# Patient Record
Sex: Male | Born: 1960 | Race: White | Hispanic: No | Marital: Married
Health system: Midwestern US, Community
[De-identification: ages and names within clinical notes are randomized; demographics above are authoritative.]

---

## 2016-07-20 LAB — CBC W/O DIFF
HCT: 40.2 % — ABNORMAL LOW (ref 42.00–52.00)
HGB: 14.6 g/dL (ref 14.00–18.00)
MCH: 30.8 pg (ref 28.0–34.0)
MCHC: 36.3 g/dL — ABNORMAL HIGH (ref 32.0–36.0)
MCV: 84.8 fL (ref 80.0–100.0)
MPV: 11.2 fL (ref 7.0–12.0)
PLATELET: 196 X10 (ref 150–400)
RBC: 4.74 x10 (ref 4.50–6.00)
RDW-CV: 12.7 % (ref 11.5–13.5)
WBC: 6.3 X10 (ref 4.8–10.8)

## 2016-07-20 LAB — PROTHROMBIN TIME + INR: INR: 1 (ref 0.9–1.1)

## 2016-07-20 LAB — DIFFERENTIAL, AUTO.
ABS. BASOPHILS: 0 x10 (ref 0.0–0.2)
ABS. EOSINOPHILS: 0.1 x10 (ref 0.0–0.5)
ABS. IMMATURE GRANS.: 0.01 x10 (ref 0.00–0.03)
ABS. LYMPHOCYTES: 1.7 x10 (ref 1.0–4.5)
ABS. MONOCYTES: 0.6 x10 (ref 0.1–0.8)
ABS. NEUTROPHILS: 3.9 x10 (ref 1.9–7.8)
ABSOLUTE NRBC: 0 x10
BASOPHILS: 1 %
EOSINOPHILS: 1 %
IMMATURE GRANULOCYTES: 0 %
LYMPHOCYTES: 27 %
MONOCYTES: 9 %
NEUTROPHILS: 63 %
NRBC: 0 /100 WBC (ref 0–1)

## 2016-07-20 LAB — METABOLIC PANEL, COMPREHENSIVE
A-G Ratio: 1.3
ALT (SGPT): 18 U/L (ref 3–35)
AST (SGOT): 19 U/L (ref 15–40)
Albumin: 4.5 g/dL (ref 3.5–5.0)
Alk. phosphatase: 56 U/L (ref 35–100)
Anion gap: 14 mmol/L
BUN/Creatinine ratio: 22.2
BUN: 20 mg/dL (ref 7–20)
Bilirubin, total: 0.9 mg/dL (ref 0.1–1.2)
CO2: 22 mmol/L (ref 20.0–32.0)
Calcium: 9.3 mg/dL (ref 8.8–10.5)
Chloride: 101 mmol/L (ref 100–110)
Creatinine: 0.9 mg/dL (ref 0.40–1.20)
GFR est AA: >60 (ref >60)
GFR est non-AA: >60 (ref >60)
Globulin: 3.5 g/dL
Glucose: 105 mg/dL (ref 75–110)
Potassium: 3.8 mmol/L (ref 3.5–5.0)
Protein, total: 8 g/dL (ref 6.2–8.0)
Sodium: 133 mmol/L — ABNORMAL LOW (ref 135–145)

## 2016-07-20 LAB — PTT: aPTT: 27 Seconds (ref 23–31)

## 2016-07-20 LAB — LIPASE: Lipase: 25 U/L (ref 10–58)

## 2016-07-20 LAB — TROPONIN I: Troponin-I, Qt.: <0.04 ng/mL (ref 0.0–0.03)

## 2017-09-17 IMAGING — CT CT Chest W-Infusion Contrast
2 of 4 series · 14 of 36 positions shown, 18 images · IV contrast (omnipaque)
Comparison: Chest CT 05/14/2017

CT Chest W-Infusion Contrast
INDICATION: 394.N MALIGNANT NEOPLASM OF UNSPECIFIED KIDNEY, EXCEPT RENAL                 
 PELVIS.                                                                                   
 Pertinent History: Cough, patient states this is a follow up to his kidney cancer. No     
 complaints at this time.                                                                  
 Surgical History:                                                                         
 Cancer: Right Renal surgery only.                                                         
 Smoking status: Previous                                                                  
 GFR (past 30 days): Not applicable                                                        
 Metformin: None                                                                           
 Intravenous contrast: 50 mL Omnipaque 350                                                 
 Comments: None
TECHNIQUE: Routine with IV contrast.  Helical acquisition with sagittal and coronal       
 reformats.                                                                                
 Utilized dose reduction techniques include: Automated Exposure Control, vendor specific   
 iterative reconstruction technique

[Series 2: ax post · axial · 0.77mm/px · z∈[+1046,+1332]mm · 13 of 65 slices shown, 17 images]
[im 5/65  mediastinal]
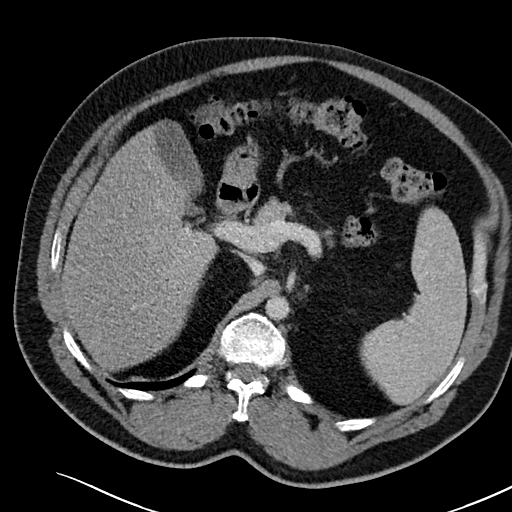
[im 5/65  lung]
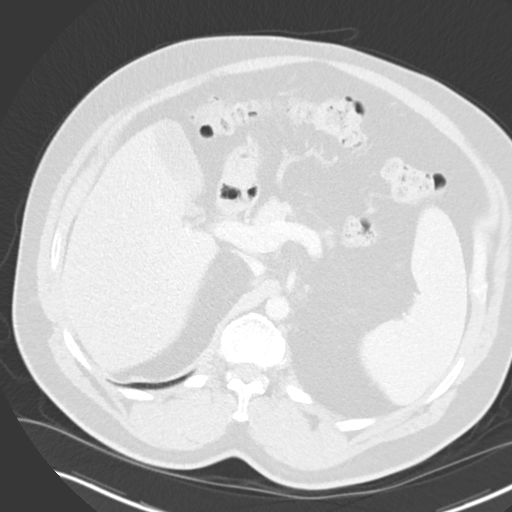
[im 10/65  lung]
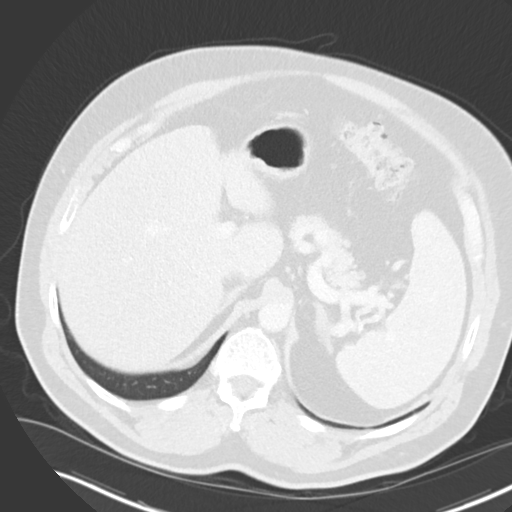
[im 15/65  lung]
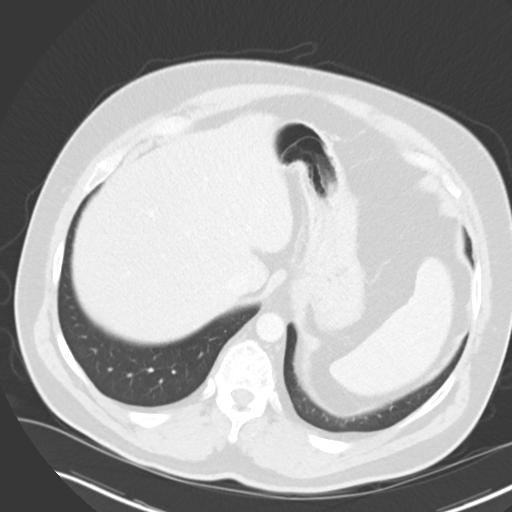
[im 19/65  lung]
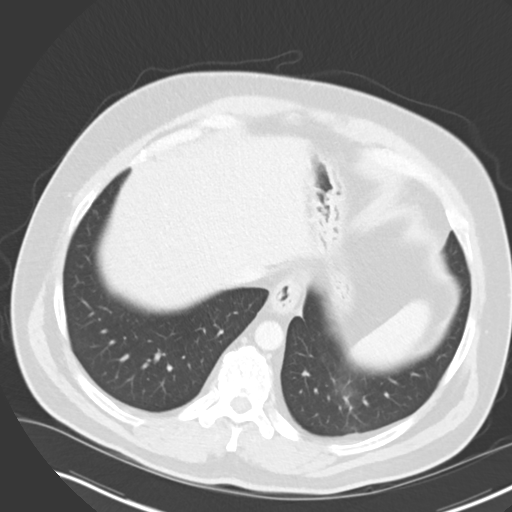
[im 24/65  mediastinal]
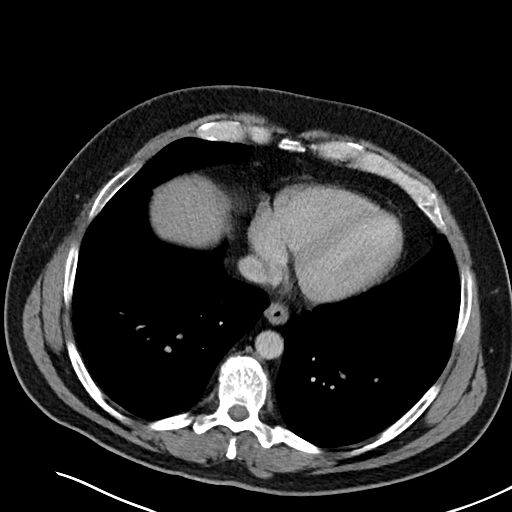
[im 24/65  lung]
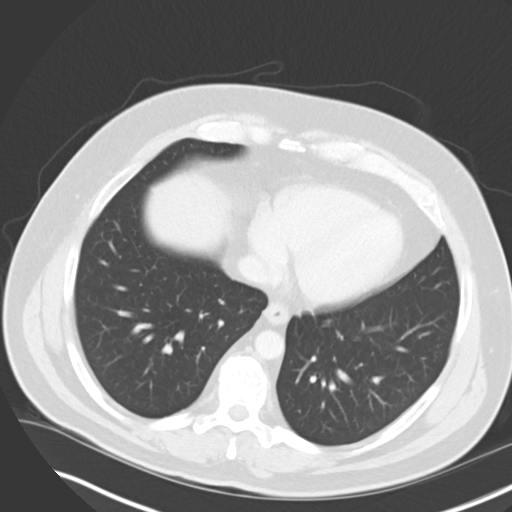
[im 29/65  lung]
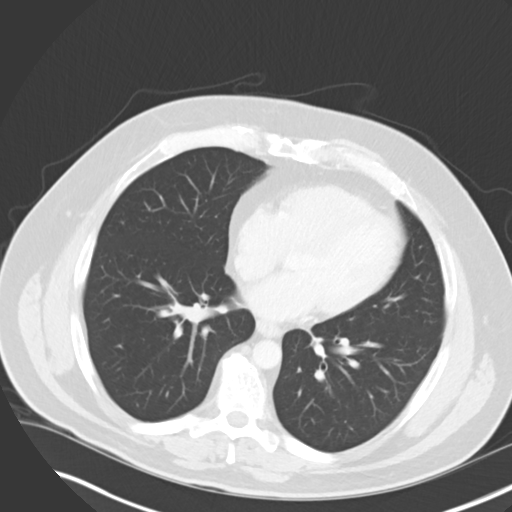
[im 34/65  lung]
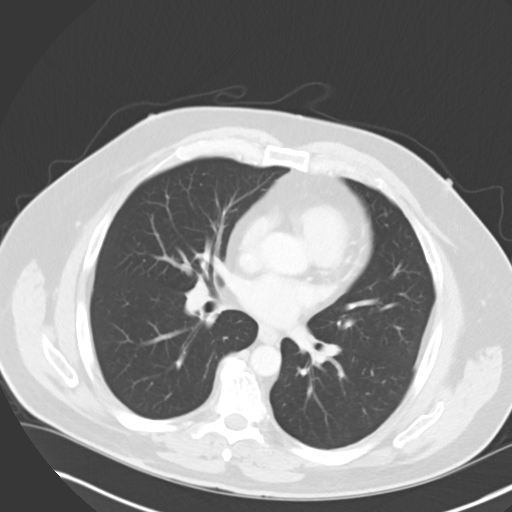
[im 38/65  lung]
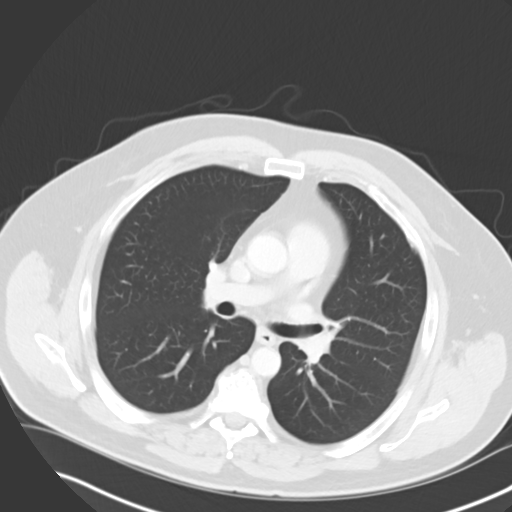
[im 43/65  mediastinal]
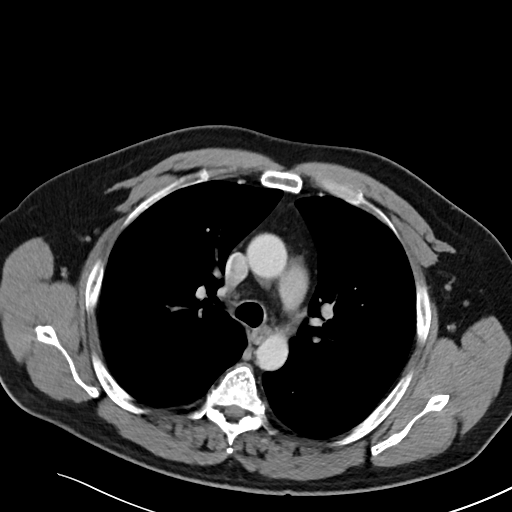
[im 43/65  lung]
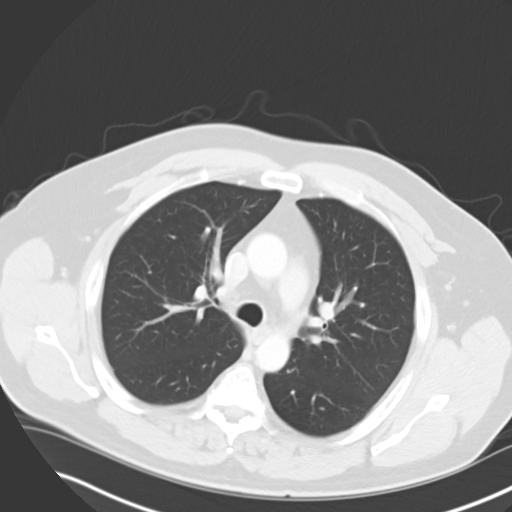
[im 48/65  lung]
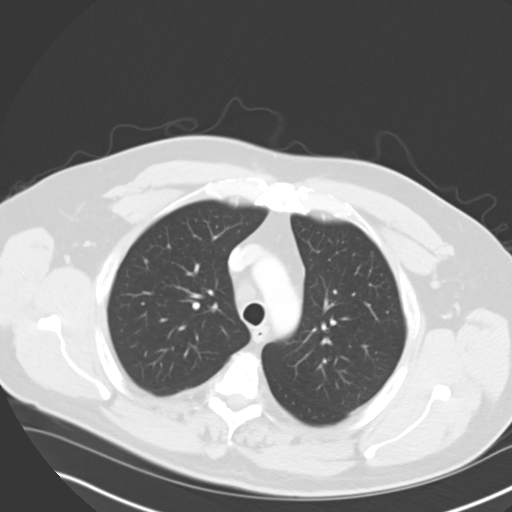
[im 53/65  lung]
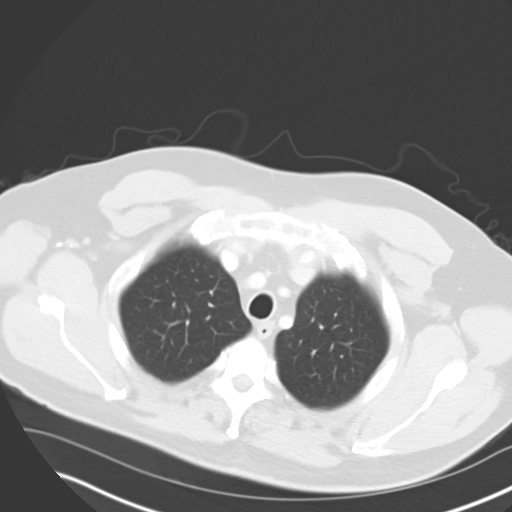
[im 57/65  lung]
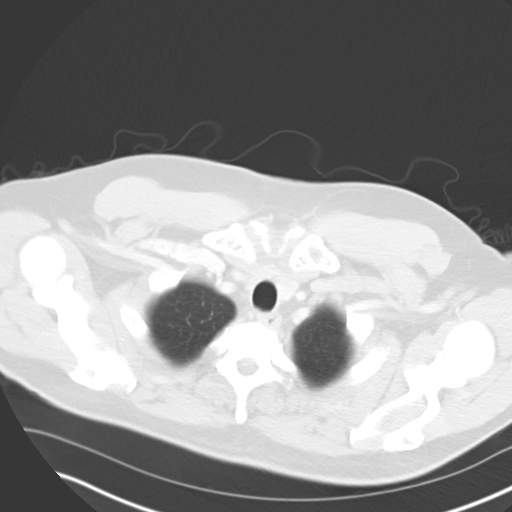
[im 62/65  mediastinal]
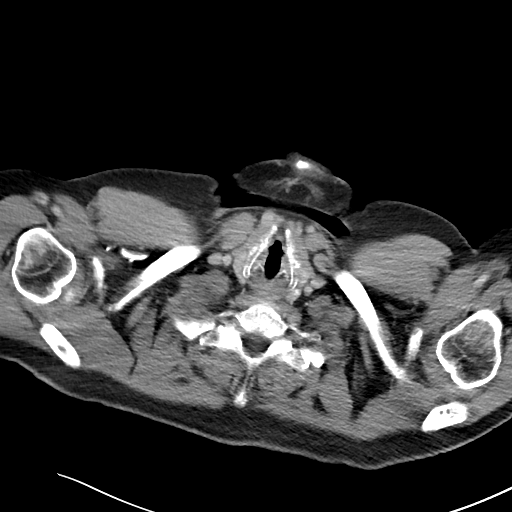
[im 62/65  lung]
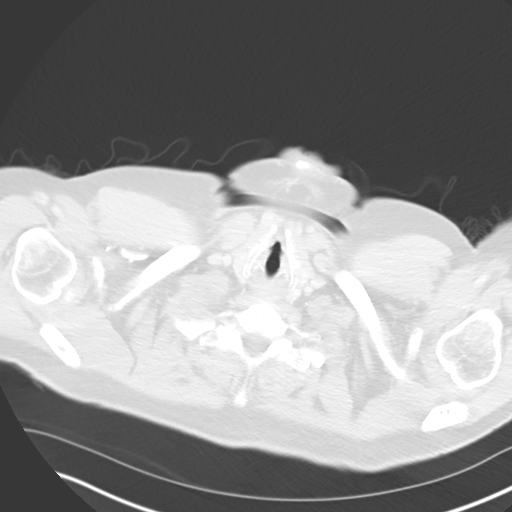

[Series 6: cor post · coronal · 0.65mm/px · 1 of 110 slices shown]
[im 55/110  lung]
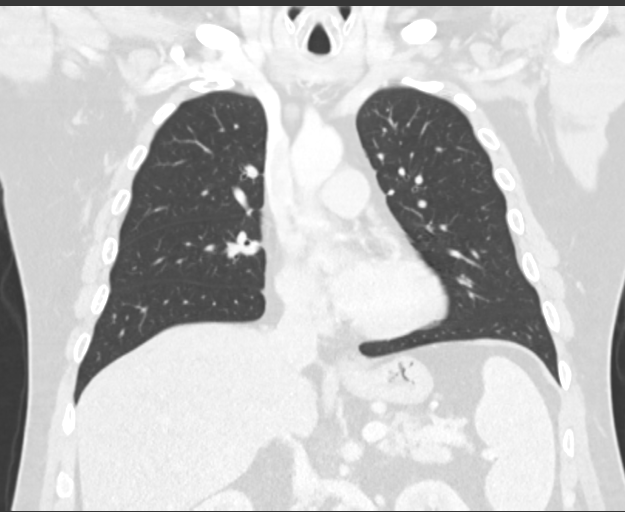

[14 of 36 positions shown; findings below may reference images not displayed]

FINDINGS: Limited views of the neck base are unremarkable.                                          
 No pathologically enlarged axillary, hilar, or mediastinal lymphadenopathy.               
 No aneurysmal dilation of the thoracic aorta or pericardial effusion.                     
 Views of the included upper abdomen demonstrate normal contour to the bilateral adrenal   
 glands.                                                                                   
 No pneumothorax or pleural effusion. There is a 2 mm nodule at the left lung base on      
 series 3, image 44. This is unchanged compared to December 2013. No new or enlarging lung  
 nodules. Several right lung 2 to 3 mm nodules are unchanged, such as seen at the right    
 upper lobe on images 18 and 25. A 2 mm nodule adjacent to the right minor fissure at the  
 right middle lobe on series 3, image 31 is also unchanged. These are unchanged compared   
 to the prior chest CT.  2- 3 mm nodules on images 41 and 46 at the lateral right lower    
 lobe are also unchanged compared to 5315.                                                 
 No suspicious bony lesions. There are degenerative changes of the thoracic                
 spine.
IMPRESSION: Small lung are unchanged compared to May 2017. Nodules at the lung bases are unchanged   
 compared to 5315. Suggest follow-up chest CT without IV contrast in 1 year.

## 2017-12-30 ENCOUNTER — Inpatient Hospital Stay: Admit: 2017-12-30 | Discharge: 2017-12-30 | Attending: Emergency Medicine

## 2017-12-30 DIAGNOSIS — R51 Headache: Secondary | ICD-10-CM

## 2017-12-30 NOTE — ED Notes (Signed)
Pt decided not to check in because he was feeling better.

## 2019-10-19 IMAGING — CT CT Abdomen and Pelvis W-Contrast
2 of 3 series · 16 of 46 positions shown, 18 images · IV contrast (agent unspecified)
Comparison: 10/18/2018                                                                    
 Cancer: Right renal- Partial right nephrectomy.

CT Abdomen and Pelvis W-Contrast
INDICATION: Malignant neoplasm of sigmoid colon. Follow up to colon cancer.
TECHNIQUE: Routine with IV contrast. Helical acquisition with sagittal and coronal        
 reformations; post IV contrast images.                                                    
 Utilized dose reduction techniques include: Automated Exposure Control, vendor specific   
 iterative reconstruction technique

[Series 2: ax post a/p · axial · 0.90mm/px · z∈[+496,+922]mm · 13 of 99 slices shown, 15 images]
[im 7/99  soft-tissue]
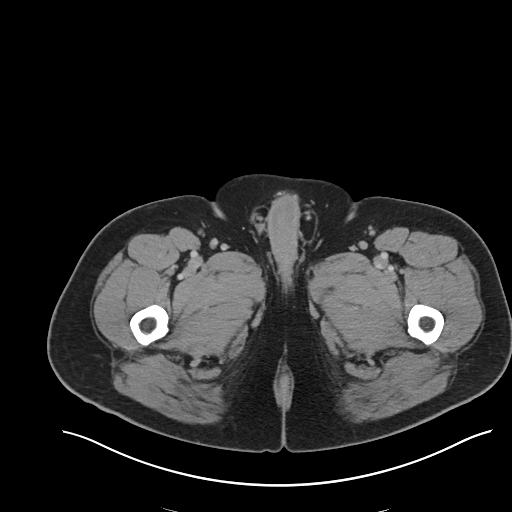
[im 7/99  bone]
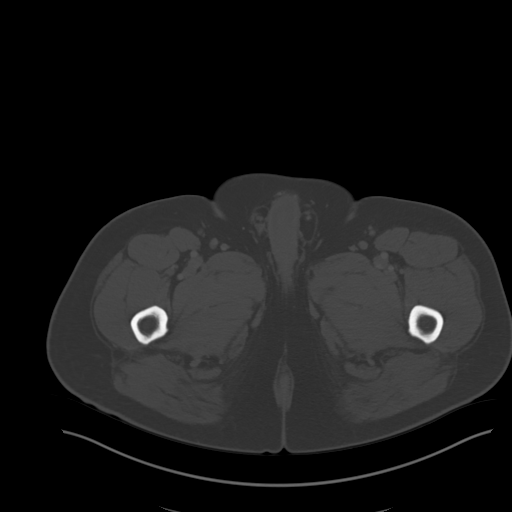
[im 13/99  soft-tissue]
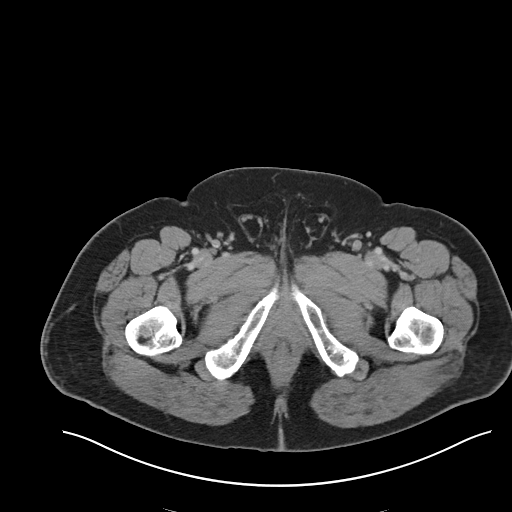
[im 19/99  soft-tissue]
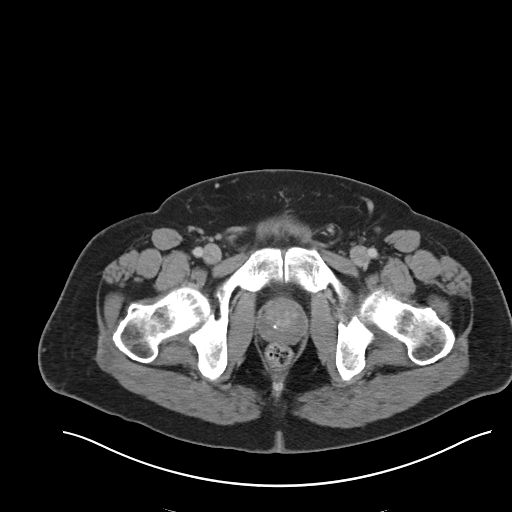
[im 29/99  soft-tissue]
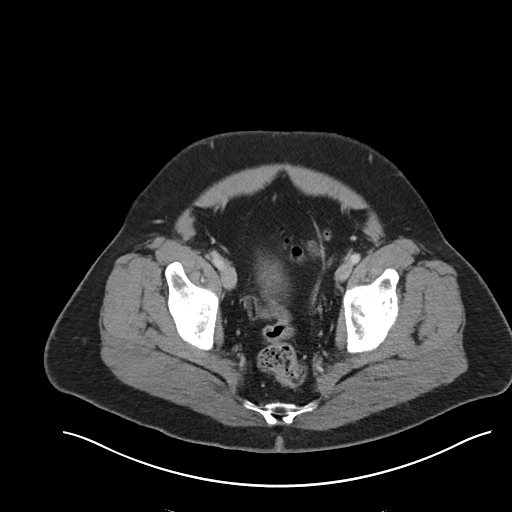
[im 35/99  soft-tissue]
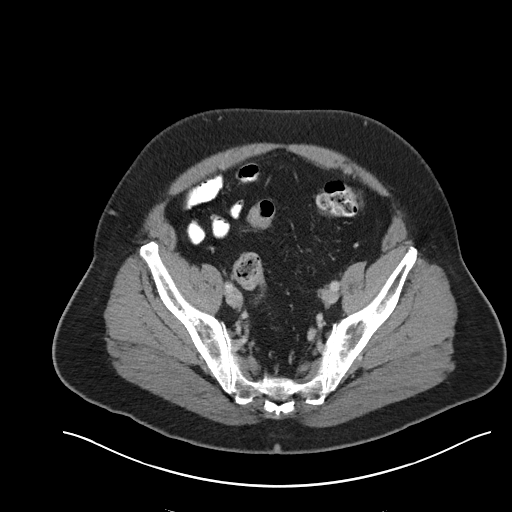
[im 42/99  soft-tissue]
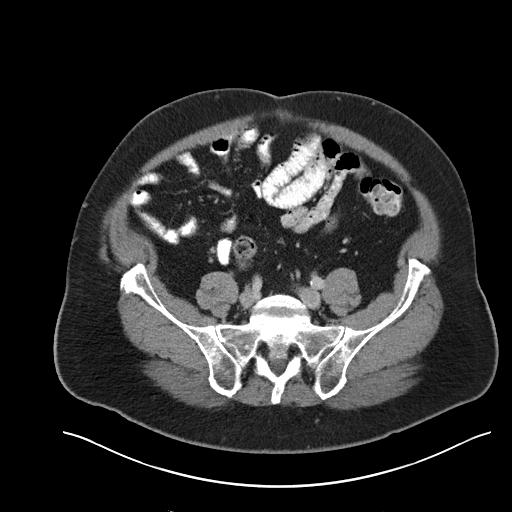
[im 51/99  soft-tissue]
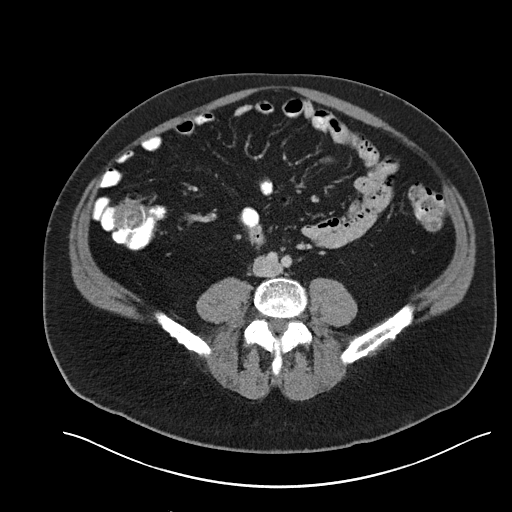
[im 57/99  soft-tissue]
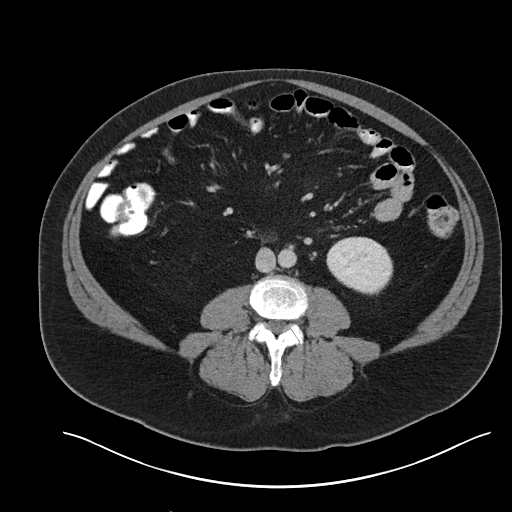
[im 64/99  soft-tissue]
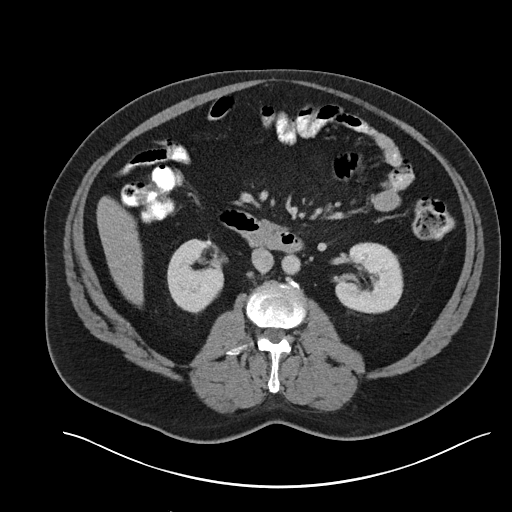
[im 64/99  bone]
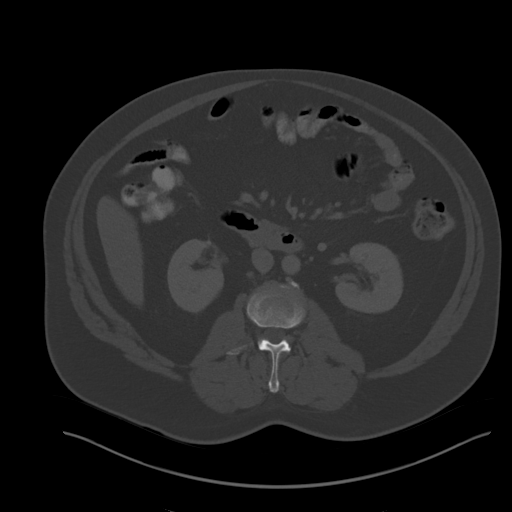
[im 70/99  soft-tissue]
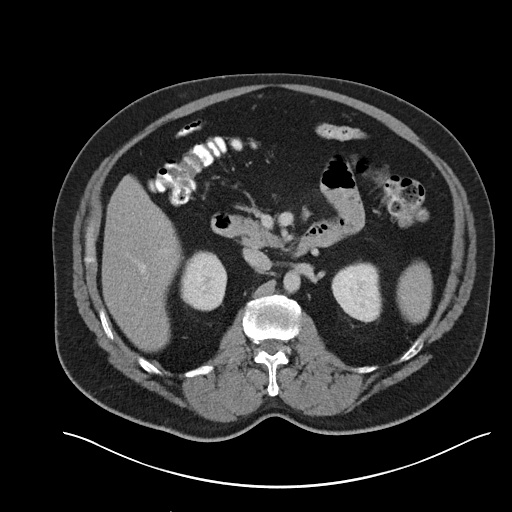
[im 80/99  soft-tissue]
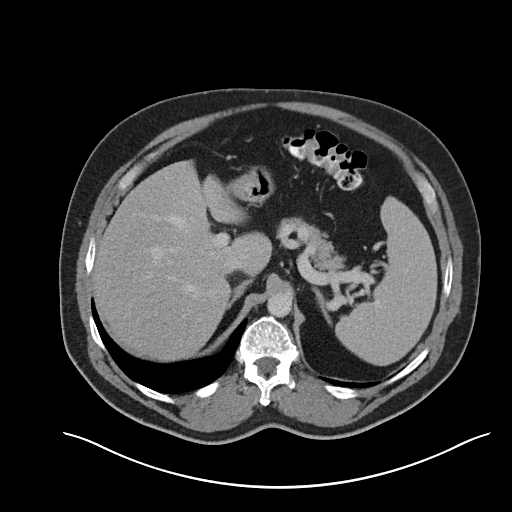
[im 86/99  soft-tissue]
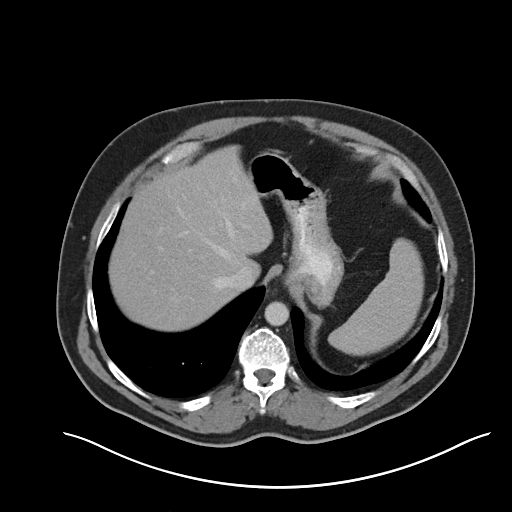
[im 92/99  soft-tissue]
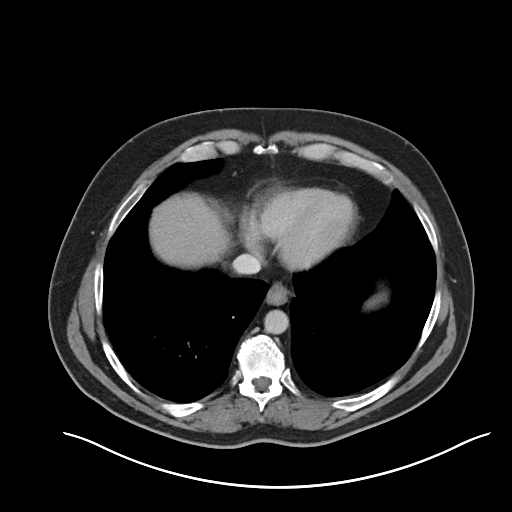

[Series 3: cor post · coronal · 0.82mm/px · 3 of 66 slices shown]
[im 22/66  soft-tissue]
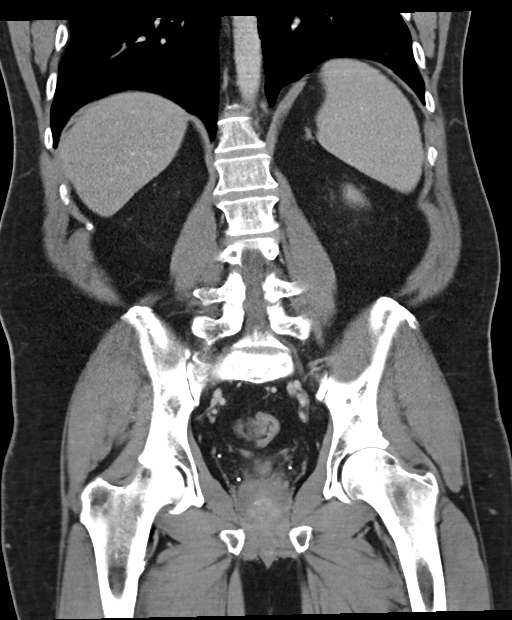
[im 29/66  soft-tissue]
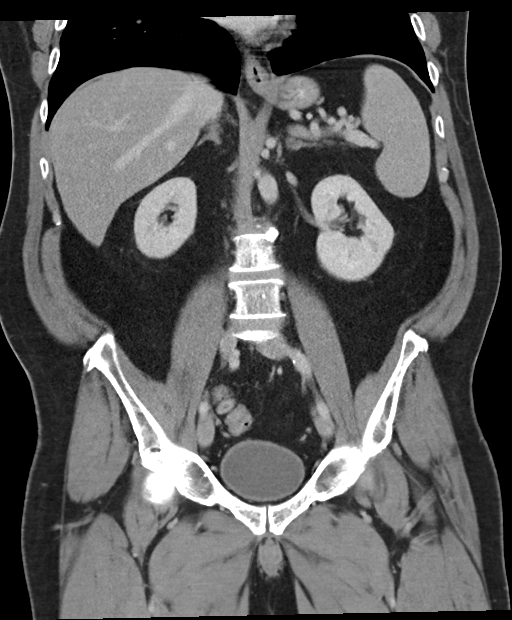
[im 37/66  soft-tissue]
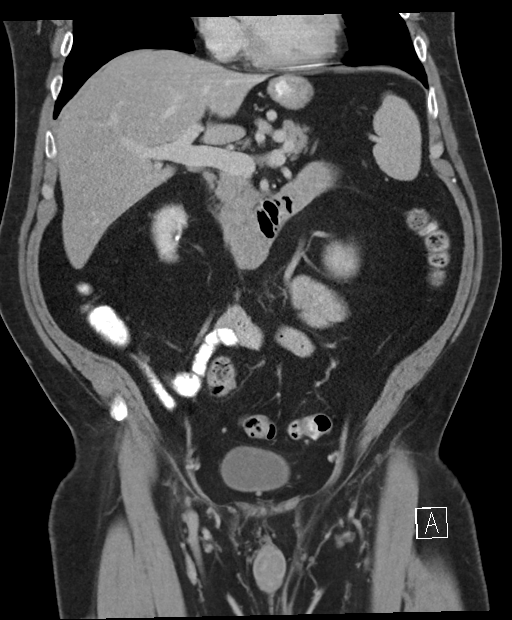

[16 of 46 positions shown; findings below may reference images not displayed]

Colon- Colon removal.                     
 Intravenous contrast: 100 mL Omnipaque 350                                                
 Oral contrast: Yes
FINDINGS: BOWEL: Grossly unremarkable. Normal appearing appendix.                                   
 LYMPH NODES: No lymphadenopathy.                                                          
 LIVER, GALLBLADDER AND BILIARY SYSTEM: No suspicious focal hepatic abnormality            
 appreciated.  Tiny gallstone. No biliary ductal dilatation.                               
 SPLEEN: Unremarkable.                                                                     
 PANCREAS: Unremarkable.                                                                   
 ADRENALS: No mass.                                                                        
 KIDNEYS: Symmetric nephrograms without hydronephrosis.                                    
 PERITONEUM/MESENTERY: No free fluid.                                                      
 VESSELS: No abdominal aortic aneurysm.                                                    
 ABDOMINAL WALL: Small fat-containing bilateral inguinal hernias.                          
 BONES: Intact.                                                                            
 LUNG BASES: Unremarkable.
IMPRESSION: 1.  No evidence of recurrent malignancy or metastatic disease appreciated in the abdomen  
 or pelvis.                                                                                
 2.  No acute abdominal or pelvic process appreciated.                                     
 3.  Cholelithiasis.

## 2022-03-31 IMAGING — MR MR Lumbar Spine W W-O Contrast
4 of 7 series · 17 of 48 positions shown · IV contrast (dotarem)
Comparison: none

MR lumbar spine
CLINICAL HISTORY: Colon cancer. Partial right nephrectomy for renal cell                  
 carcinoma. Esophageal carcinoma.
TECHNIQUE: Sagittal and selected axial T1 and T2 fat spin-echo; sagittal                  
 STIR.                                                                                     
 Postcontrast sagittal and axial T1 fat-sat.                                               
 Contrast: 20 cc Dotarem.

[Series 14: T2 · sagittal · 3.5mm · 0.55mm/px · 3 of 20 slices shown (1 of 2)]
[im 1/20]
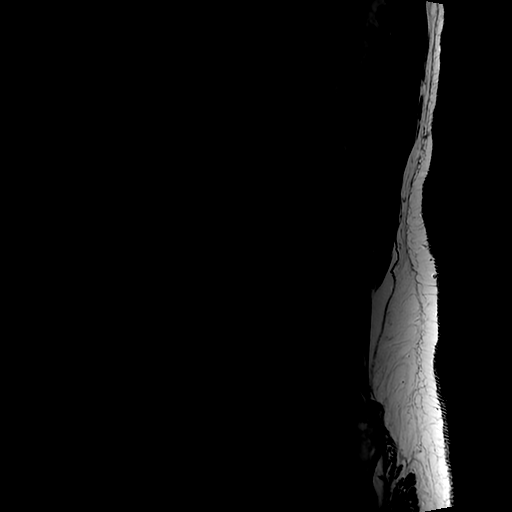
[im 10/20]
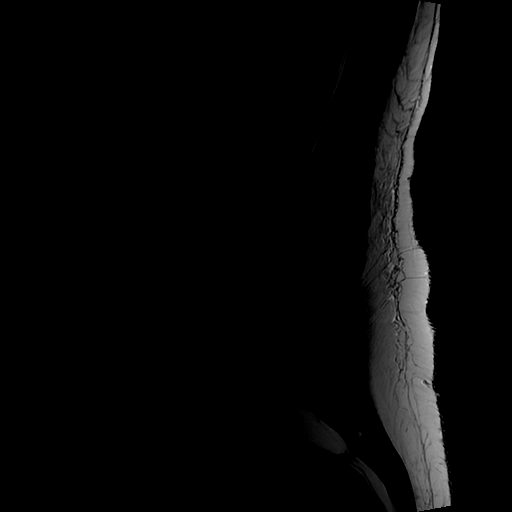
[im 20/20]
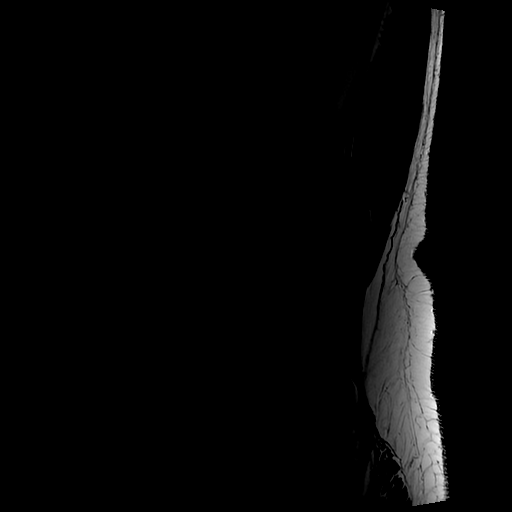

[Series 16: T1 · sagittal · 3.5mm · 0.55mm/px · 3 of 20 slices shown (1 of 2)]
[im 1/20]
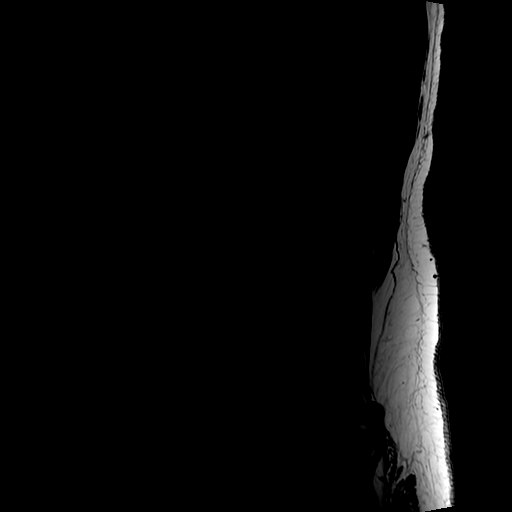
[im 13/20]
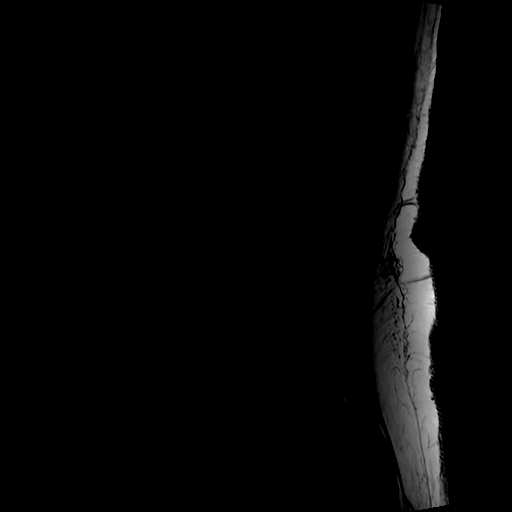
[im 20/20]
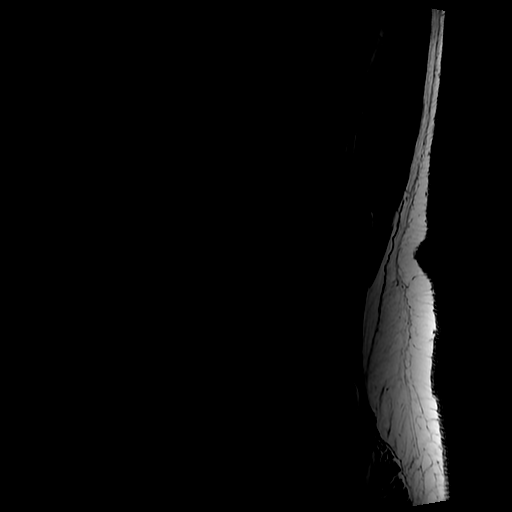

[Series 17: T2 · axial · 3.5mm · 0.39mm/px · z∈[-472,-252]mm · 8 of 56 slices shown (2 of 2)]
[im 1/56]
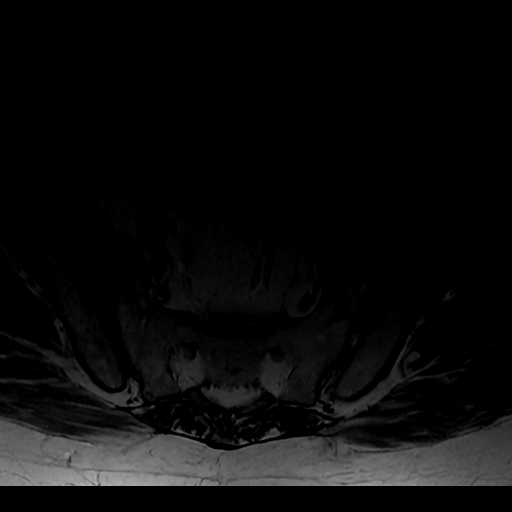
[im 6/56]
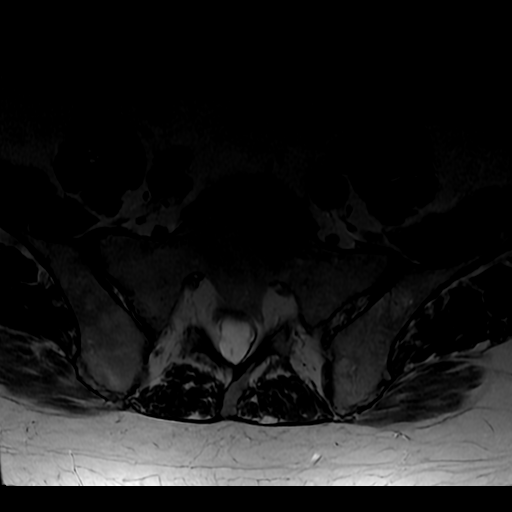
[im 12/56]
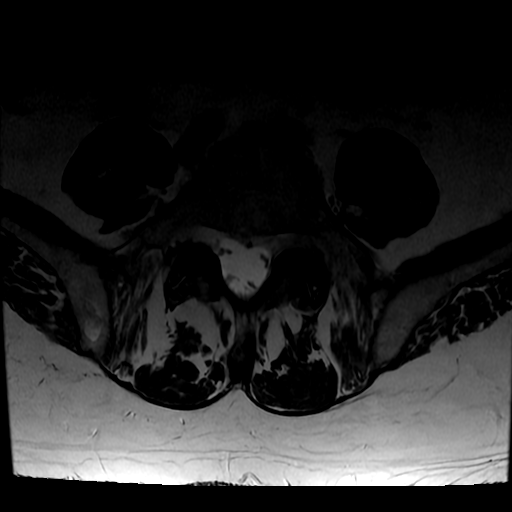
[im 17/56]
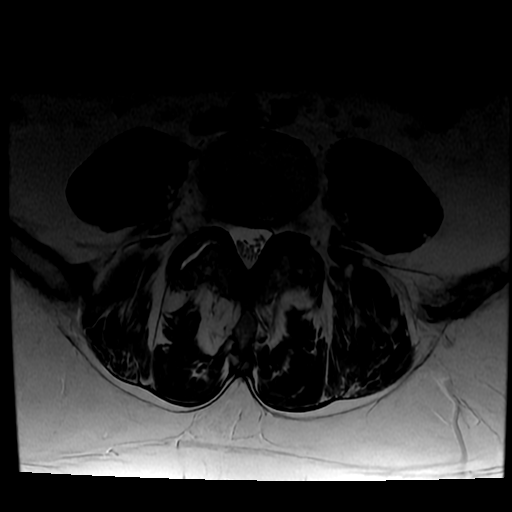
[im 23/56]
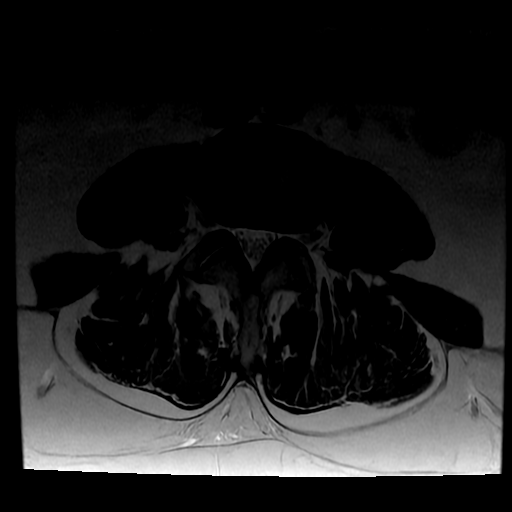
[im 28/56]
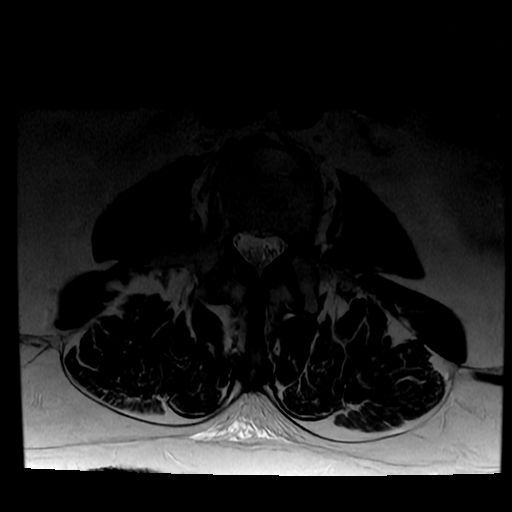
[im 34/56]
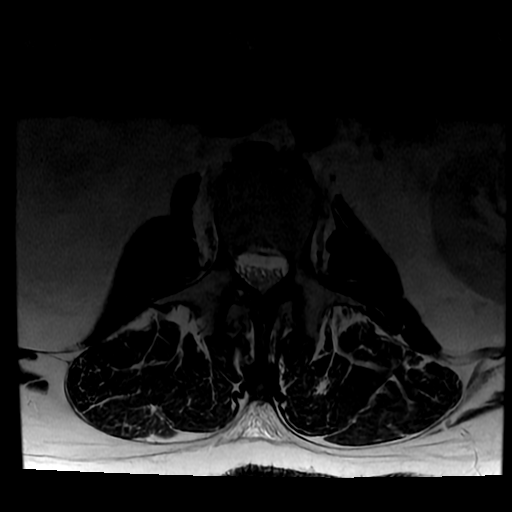
[im 50/56]
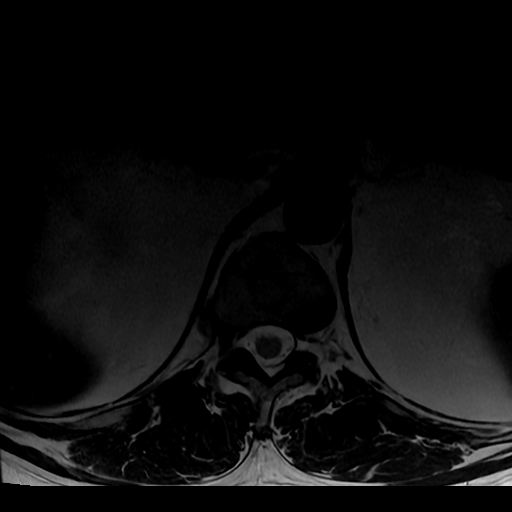

[Series 18: T1 · axial · 3.5mm · 0.39mm/px · z∈[-450,-252]mm · 3 of 56 slices shown (2 of 2)]
[im 6/56]
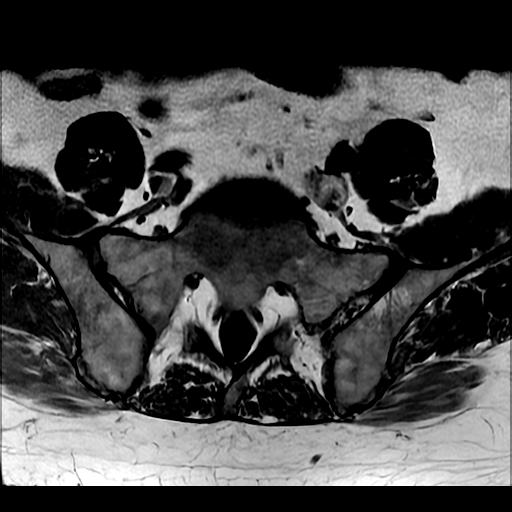
[im 28/56]
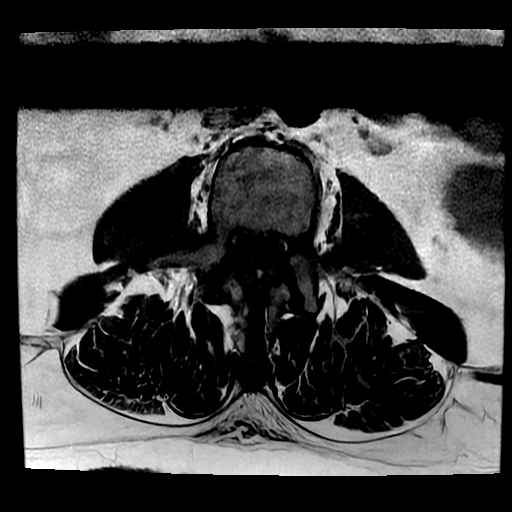
[im 50/56]
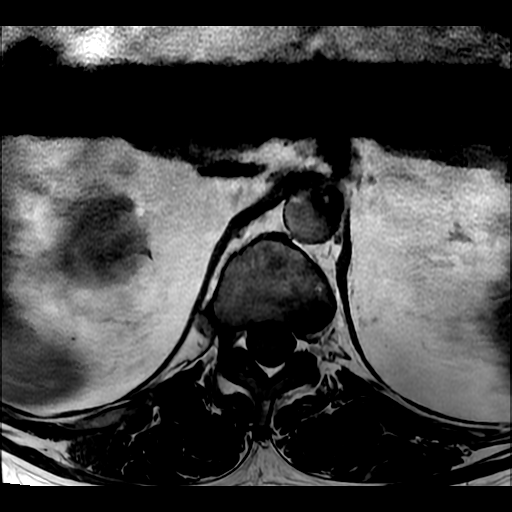

[17 of 48 positions shown; findings below may reference images not displayed]

FINDINGS: No prior lumbar MR available.
FINDINGS: Assuming 5 nonrib-bearing lumbar type vertebrae.                                          
 Decreased signal L1-L2 through L4-L5 disc reflecting degenerative disc disease.           
 Disc base height well maintained.                                                         
 Ovoid focus increased signal L3 vertebral body probably flattening                        
 incidental                                                                                
 hemangioma.                                                                               
 1 cm focus of increased signal posterior superior L1 vertebral body to the left           
 with associated enhancement; nonspecific. This may reflect atypical                       
 hemangioma;                                                                               
 metastatic deposit not excluded.                                                          
 Slight heterogeneous marrow signal and enhancement posterior medial aspects               
 iliac bones bilaterally nonspecific and could reflect changes due to aging or             
 mechanical stress. No osseous destruction.                                                
 Conus medullaris normal.                                                                  
 Ovoid 4 mm enhancing nodules to the right and left of midline along the cauda             
 equina mid L4 level.                                                                      
 Enhancement about the L4-L5 and L5-S1 facet joints probably due to                        
 synovial                                                                                  
 hyperplasia/synovitis.                                                                    
 Prevertebral and posterior paraspinal soft tissues unremarkable. Cyst posterior           
 right kidney.                                                                             
 T12-L1, L1-L2, L2-L3: Degenerative facet arthritis. No central canal stenosis             
 or                                                                                        
 foraminal narrowing.                                                                      
 L3-L4: Borderline central canal stenosis due to disc bulging, joint facet                 
 arthropathy and ligamentum flavum redundancy superimposed on congenital central           
 spinal narrowing. No significant foraminal narrowing.                                     
 L4-L5: Mild central canal stenosis due to disc bulging, advanced degenerative             
 facet arthropathy and ligamentum flavum redundancy. Lateral recess stenosis               
 primarily on the left. Mild foraminal narrowing on the left.                              
 L5-S1: Moderate degenerative facet arthritis. Minimal posterior disc bulging.             
 No                                                                                        
 significant foraminal narrowing.
IMPRESSION: 1. Enhancing nodules cauda equina; probably reflect incidental benign nerve               
 sheath tumors. Metastatic deposits not excluded but felt to be less likely.               
 Recommend clinical/laboratory correlation and continued MRI follow-up.                    
 2. Borderline L3-L4 and mild L4-L5 central canal stenosis.                                
 3. L3 hemangioma. Indeterminate L1 lesion; may reflect atypical hemangioma.               
 Osseous metastatic deposit not excluded but felt to be less likely.                       
 Recommend                                                                                 
 clinical/laboratory correlation; bone scan correlation.

## 2022-04-02 IMAGING — MR MR Brain W W-O Contrast
8 of 13 series · 19 of 48 positions shown · IV contrast (20 DOTAREM)
Comparison: None available

MRI brain without and with contrast
INDICATION: Headaches, dizziness, metastatic cancer
TECHNIQUE: Multiplanar, multisequence MR imaging of the brain was performed               
 prior to and following the IV administration of 20 cc of Dotarem.

[Series 2: DWI · axial · 5.0mm · 0.94mm/px · z∈[-34,+141]mm · 3 of 72 slices shown]
[im 1/72]
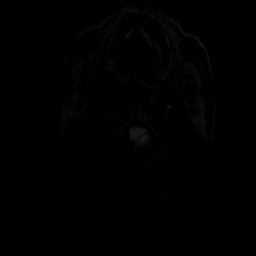
[im 36/72]
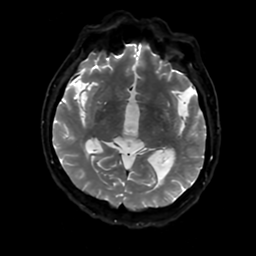
[im 72/72]
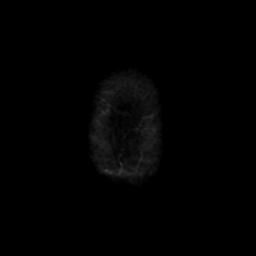

[Series 4: FLAIR · sagittal · 5.0mm · 0.47mm/px · 1 of 27 slices shown (1 of 3)]
[im 1/27]
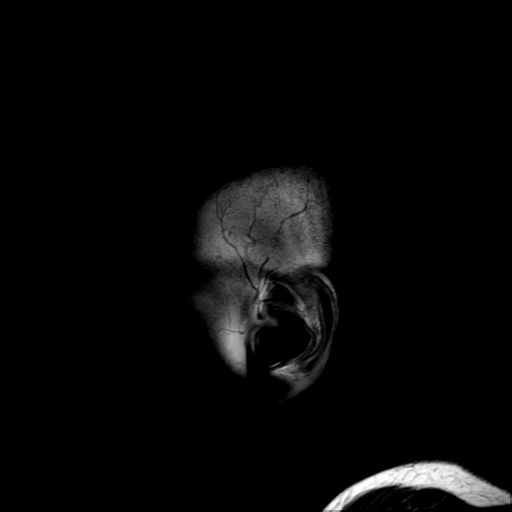

[Series 5: T2-star · axial · 2.6mm · 0.47mm/px · z∈[-44,+43]mm · 2 of 64 slices shown]
[im 1/64]
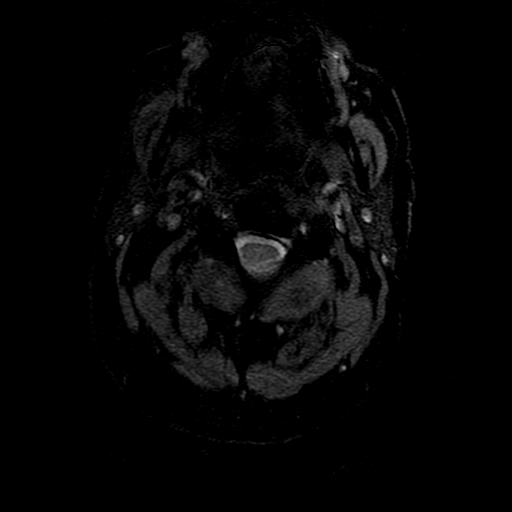
[im 32/64]
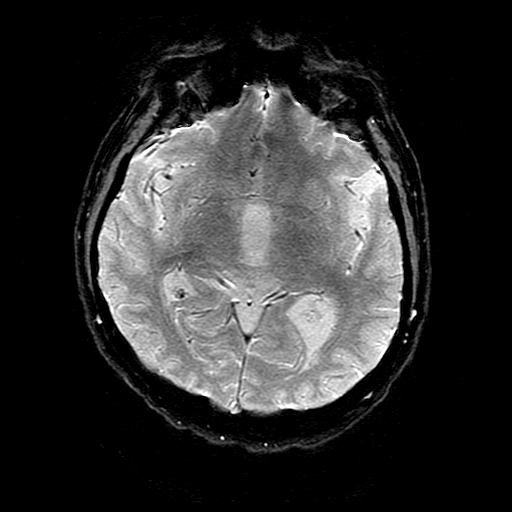

[Series 14: T2 · axial · 5.0mm · 0.47mm/px · 1 of 28 slices shown]
[im 1/28]
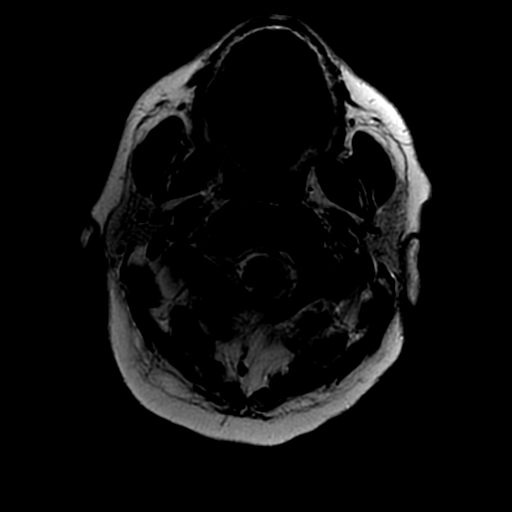

[Series 15: T1 fat-sat post-contrast · coronal · 5.0mm · 0.21mm/px · 1 of 28 slices shown]
[im 1/28]
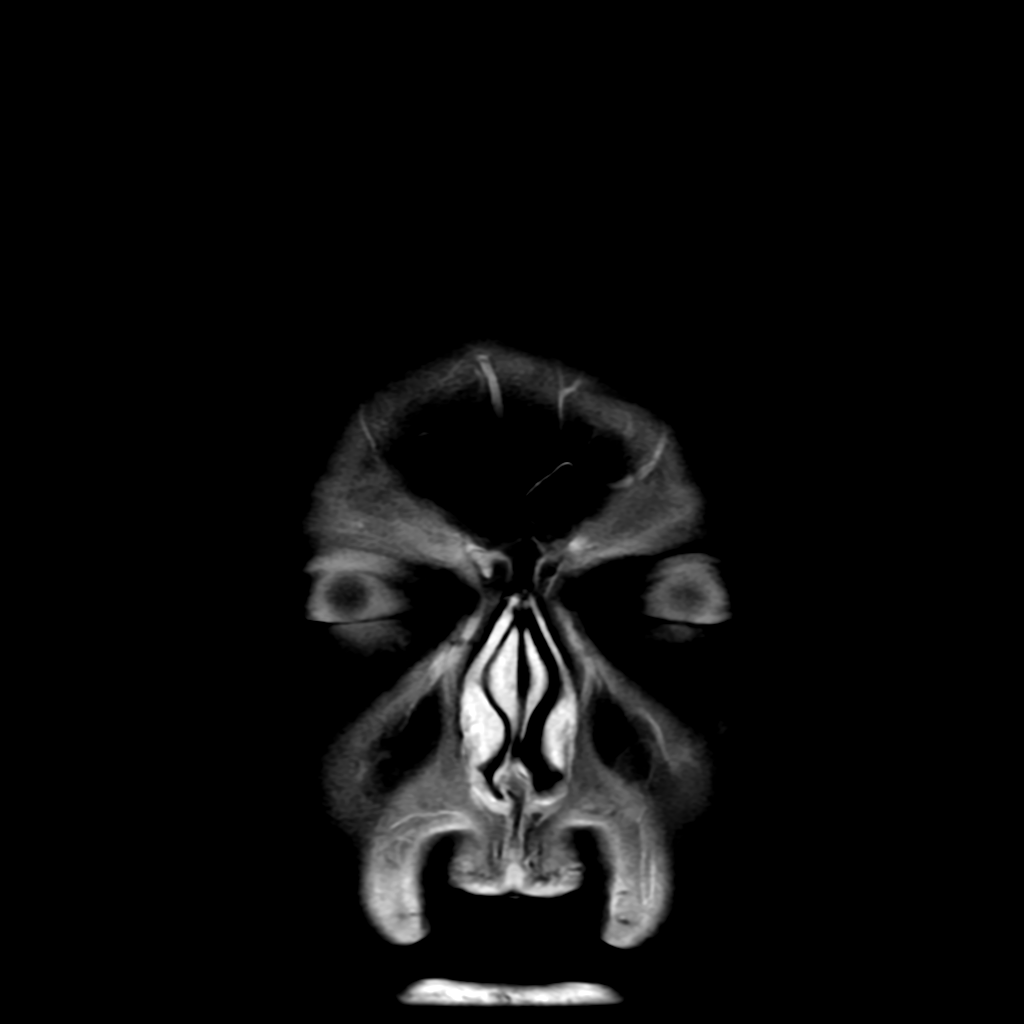

[Series 250: ADC · axial · 5.0mm · 0.94mm/px · 1 of 36 slices shown]
[im 1/36]
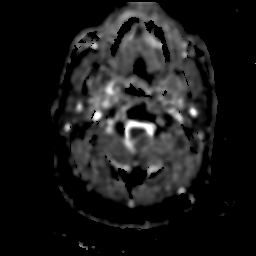

[Series 300: FLAIR · axial · 2.0mm · 0.41mm/px · z∈[-84,+170]mm · 5 of 130 slices shown (2 of 3)]
[im 1/130]
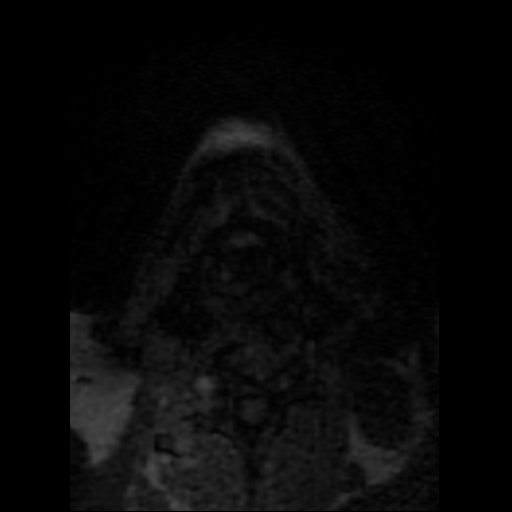
[im 33/130]
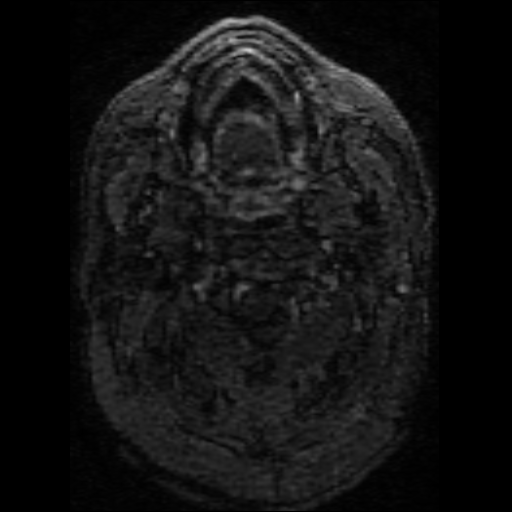
[im 65/130]
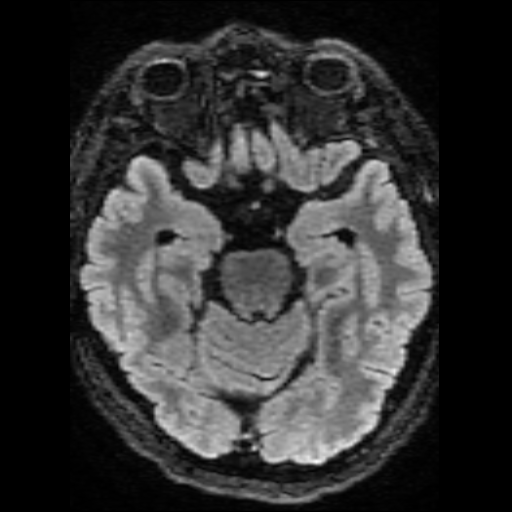
[im 97/130]
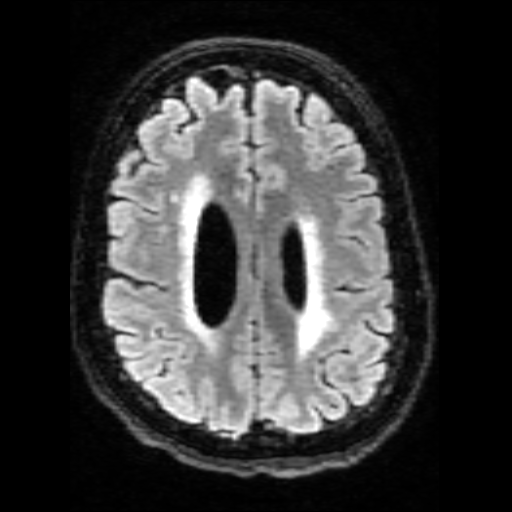
[im 130/130]
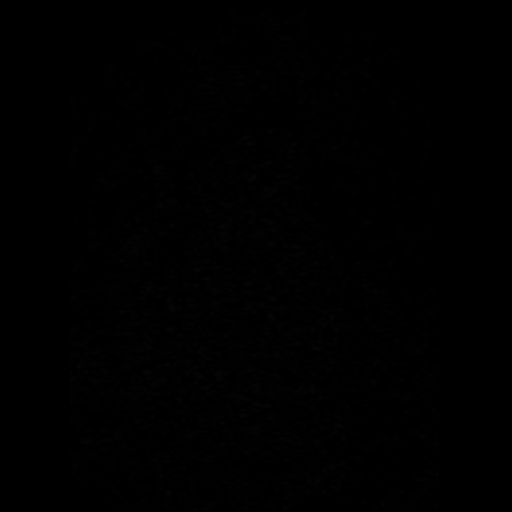

[Series 301: FLAIR · coronal · 2.0mm · 0.41mm/px · 5 of 130 slices shown (3 of 3)]
[im 1/130]
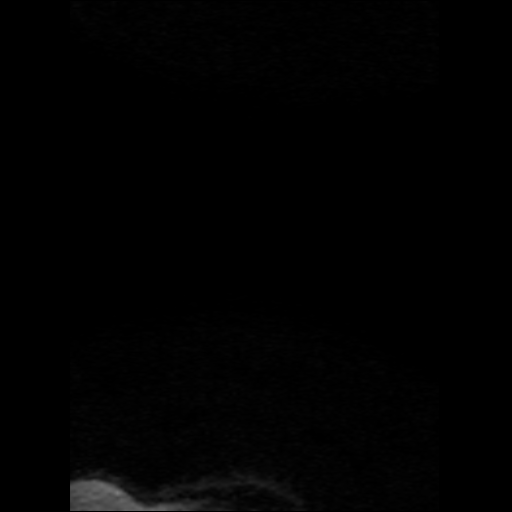
[im 33/130]
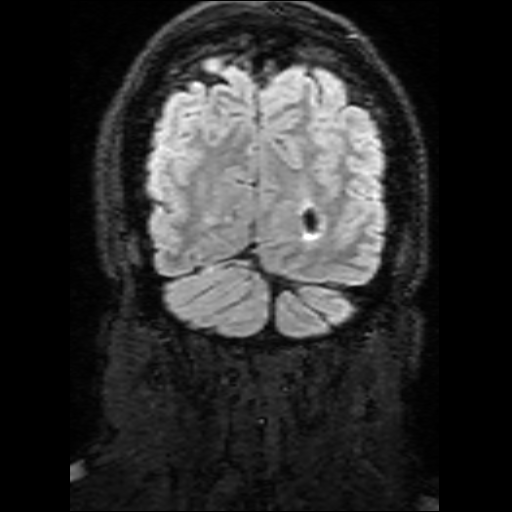
[im 65/130]
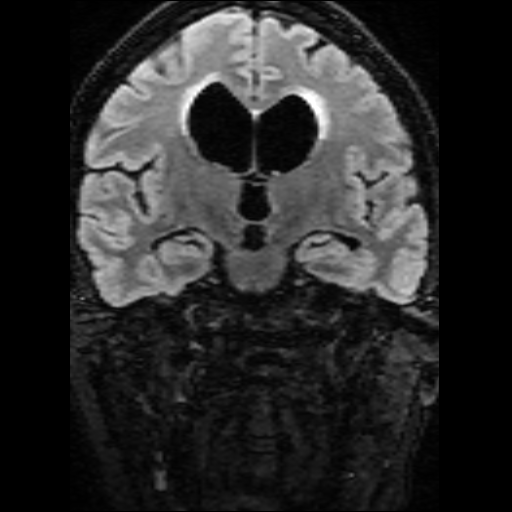
[im 97/130]
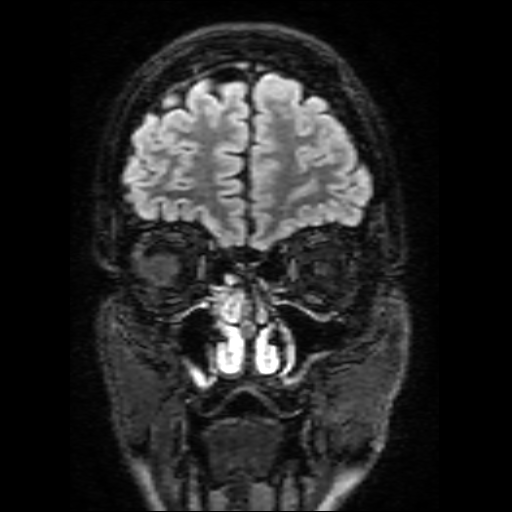
[im 130/130]
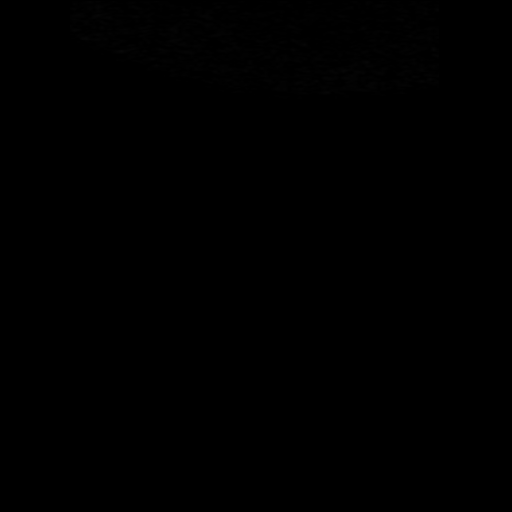

[19 of 48 positions shown; findings below may reference images not displayed]

FINDINGS: Brain parenchymal volume appears appropriate. Chiari I malformation with                  
 cerebellar tonsils extending to the level of the C2-3 disc space approximately            
 2.8 cm caudal to the level of the foramen magnum with abnormal morphology.                
 Moderate hydrocephalus involving the lateral and third ventricles with                    
 mild                                                                                      
 transependymal edema. Mild T2/FLAIR signal hyperintensities in the                        
 periventricular and subcortical white matter.                                             
 No pathologic intracranial enhancement. No acute ischemia. No abnormal                    
 extra-axial fluid collection, mass effect or midline shift evident. No                    
 intracranial hemorrhage.                                                                  
 Minimal mucosal thickening in the maxillary sinuses. Mastoids appear clear.               
 Globes are symmetric.
IMPRESSION: No intracranial metastatic disease evident.                                               
 Chiari I malformation as above with moderate hydrocephalus and mild                       
 transependymal edema.                                                                     
 Mild changes of chronic small vessel ischemic demyelination without acute                 
 ischemia evident.
